# Patient Record
Sex: Female | Born: 1940 | Race: White | Hispanic: No | Marital: Married | Smoking: Never smoker
Health system: Southern US, Community
[De-identification: ages and names within clinical notes are randomized; demographics above are authoritative.]

## PROBLEM LIST (undated history)

## (undated) DIAGNOSIS — I82409 Acute embolism and thrombosis of unspecified deep veins of unspecified lower extremity: Secondary | ICD-10-CM

## (undated) DIAGNOSIS — E785 Hyperlipidemia, unspecified: Secondary | ICD-10-CM

## (undated) DIAGNOSIS — G629 Polyneuropathy, unspecified: Secondary | ICD-10-CM

## (undated) DIAGNOSIS — K219 Gastro-esophageal reflux disease without esophagitis: Secondary | ICD-10-CM

## (undated) DIAGNOSIS — C50919 Malignant neoplasm of unspecified site of unspecified female breast: Secondary | ICD-10-CM

## (undated) DIAGNOSIS — I272 Pulmonary hypertension, unspecified: Secondary | ICD-10-CM

## (undated) DIAGNOSIS — G2581 Restless legs syndrome: Secondary | ICD-10-CM

## (undated) DIAGNOSIS — M549 Dorsalgia, unspecified: Secondary | ICD-10-CM

## (undated) DIAGNOSIS — J449 Chronic obstructive pulmonary disease, unspecified: Secondary | ICD-10-CM

## (undated) DIAGNOSIS — G8929 Other chronic pain: Secondary | ICD-10-CM

## (undated) HISTORY — PX: JOINT REPLACEMENT: SHX530

## (undated) HISTORY — PX: CHOLECYSTECTOMY: SHX55

## (undated) HISTORY — PX: OTHER SURGICAL HISTORY: SHX169

## (undated) HISTORY — PX: ABDOMINAL HYSTERECTOMY: SHX81

## (undated) HISTORY — PX: BREAST SURGERY: SHX581

## (undated) HISTORY — PX: CARPAL TUNNEL RELEASE: SHX101

---

## 2012-04-24 ENCOUNTER — Encounter (HOSPITAL_BASED_OUTPATIENT_CLINIC_OR_DEPARTMENT_OTHER): Payer: Self-pay | Admitting: *Deleted

## 2012-04-24 ENCOUNTER — Emergency Department (HOSPITAL_BASED_OUTPATIENT_CLINIC_OR_DEPARTMENT_OTHER)
Admission: EM | Admit: 2012-04-24 | Discharge: 2012-04-24 | Disposition: A | Discharge status: Home / Self Care | Payer: Worker's Compensation | Attending: Emergency Medicine | Admitting: Emergency Medicine

## 2012-04-24 ENCOUNTER — Emergency Department (HOSPITAL_BASED_OUTPATIENT_CLINIC_OR_DEPARTMENT_OTHER): Payer: Worker's Compensation

## 2012-04-24 DIAGNOSIS — Z862 Personal history of diseases of the blood and blood-forming organs and certain disorders involving the immune mechanism: Secondary | ICD-10-CM | POA: Insufficient documentation

## 2012-04-24 DIAGNOSIS — Z86718 Personal history of other venous thrombosis and embolism: Secondary | ICD-10-CM | POA: Insufficient documentation

## 2012-04-24 DIAGNOSIS — X500XXA Overexertion from strenuous movement or load, initial encounter: Secondary | ICD-10-CM | POA: Insufficient documentation

## 2012-04-24 DIAGNOSIS — Z8639 Personal history of other endocrine, nutritional and metabolic disease: Secondary | ICD-10-CM | POA: Insufficient documentation

## 2012-04-24 DIAGNOSIS — Z8739 Personal history of other diseases of the musculoskeletal system and connective tissue: Secondary | ICD-10-CM | POA: Insufficient documentation

## 2012-04-24 DIAGNOSIS — M659 Synovitis and tenosynovitis, unspecified: Secondary | ICD-10-CM

## 2012-04-24 DIAGNOSIS — J449 Chronic obstructive pulmonary disease, unspecified: Secondary | ICD-10-CM | POA: Insufficient documentation

## 2012-04-24 DIAGNOSIS — Z853 Personal history of malignant neoplasm of breast: Secondary | ICD-10-CM | POA: Insufficient documentation

## 2012-04-24 DIAGNOSIS — Z8669 Personal history of other diseases of the nervous system and sense organs: Secondary | ICD-10-CM | POA: Insufficient documentation

## 2012-04-24 DIAGNOSIS — Y9389 Activity, other specified: Secondary | ICD-10-CM | POA: Insufficient documentation

## 2012-04-24 DIAGNOSIS — M65849 Other synovitis and tenosynovitis, unspecified hand: Secondary | ICD-10-CM | POA: Insufficient documentation

## 2012-04-24 DIAGNOSIS — M65839 Other synovitis and tenosynovitis, unspecified forearm: Secondary | ICD-10-CM | POA: Insufficient documentation

## 2012-04-24 DIAGNOSIS — J4489 Other specified chronic obstructive pulmonary disease: Secondary | ICD-10-CM | POA: Insufficient documentation

## 2012-04-24 DIAGNOSIS — K219 Gastro-esophageal reflux disease without esophagitis: Secondary | ICD-10-CM | POA: Insufficient documentation

## 2012-04-24 DIAGNOSIS — Y929 Unspecified place or not applicable: Secondary | ICD-10-CM | POA: Insufficient documentation

## 2012-04-24 DIAGNOSIS — Z8679 Personal history of other diseases of the circulatory system: Secondary | ICD-10-CM | POA: Insufficient documentation

## 2012-04-24 DIAGNOSIS — Z79899 Other long term (current) drug therapy: Secondary | ICD-10-CM | POA: Insufficient documentation

## 2012-04-24 HISTORY — DX: Gastro-esophageal reflux disease without esophagitis: K21.9

## 2012-04-24 HISTORY — DX: Malignant neoplasm of unspecified site of unspecified female breast: C50.919

## 2012-04-24 HISTORY — DX: Restless legs syndrome: G25.81

## 2012-04-24 HISTORY — DX: Other chronic pain: G89.29

## 2012-04-24 HISTORY — DX: Chronic obstructive pulmonary disease, unspecified: J44.9

## 2012-04-24 HISTORY — DX: Dorsalgia, unspecified: M54.9

## 2012-04-24 HISTORY — DX: Hyperlipidemia, unspecified: E78.5

## 2012-04-24 HISTORY — DX: Pulmonary hypertension, unspecified: I27.20

## 2012-04-24 HISTORY — DX: Acute embolism and thrombosis of unspecified deep veins of unspecified lower extremity: I82.409

## 2012-04-24 HISTORY — DX: Polyneuropathy, unspecified: G62.9

## 2012-04-24 NOTE — ED Notes (Signed)
Pt c/o left hand injury x 2 days ago  

## 2012-04-24 NOTE — ED Provider Notes (Signed)
History     CSN: [id number]  Arrival date & time 04/24/12  1643   First MD Initiated Contact with Patient 04/24/12 1835      Chief Complaint  Patient presents with  . Hand Injury    (Consider location/radiation/quality/duration/timing/severity/associated sxs/prior treatment) Patient is a 72 y.o. female presenting with hand injury. The history is provided by the patient.  Hand Injury Location:  Hand (Pt was lifting a ream of paper yesterday.  When she picked up the paper she had a sharp pain in the palm of the right hand.  She has continued to have pain whenever she tries to pick something up with her right hand.) Time since incident:  1 day Injury: yes   Mechanism of injury comment:  She lifted a ream of paper. Hand location:  R palm Pain details:    Quality:  Sharp   Radiates to:  Does not radiate   Severity:  Moderate   Onset quality:  Sudden   Duration:  1 day   Timing:  Intermittent Chronicity:  New Handedness:  Right-handed Dislocation: no   Foreign body present:  No foreign bodies Prior injury to area:  No Relieved by:  Nothing Worsened by:  Nothing tried Ineffective treatments:  None tried Associated symptoms comment:  She has crronic back pain and postherpetic neuropathy.   Past Medical History  Diagnosis Date  . Neuropathy   . Chronic back pain   . Hyperlipemia   . DVT (deep venous thrombosis)   . RLS (restless legs syndrome)   . GERD (gastroesophageal reflux disease)   . COPD (chronic obstructive pulmonary disease)   . Pulmonary hypertension   . Breast cancer     Past Surgical History  Procedure Laterality Date  . Breast surgery    . Cholecystectomy    . Joint replacement    . Carpal tunnel release    . Bladder tact    . Abdominal hysterectomy      History reviewed. No pertinent family history.  History  Substance Use Topics  . Smoking status: Never Smoker   . Smokeless tobacco: Not on file  . Alcohol Use: No    OB History   Grav Para  Term Preterm Abortions TAB SAB Ect Mult Living                  Review of Systems  All other systems reviewed and are negative.    Allergies  Review of patient's allergies indicates not on file.  Home Medications   Current Outpatient Rx  Name  Route  Sig  Dispense  Refill  . esomeprazole (NEXIUM) 40 MG capsule   Oral   Take 40 mg by mouth daily before breakfast.         . gabapentin (NEURONTIN) 300 MG capsule   Oral   Take 300 mg by mouth 3 (three) times daily.         Marland Kitchen oxyCODONE (OXYCONTIN) 10 MG 12 hr tablet   Oral   Take 10 mg by mouth every 12 (twelve) hours.         . pramipexole (MIRAPEX) 0.5 MG tablet   Oral   Take 0.5 mg by mouth 3 (three) times daily.         . traMADol (ULTRAM) 50 MG tablet   Oral   Take 50 mg by mouth every 6 (six) hours as needed for pain.           BP 125/65  Pulse 78  Temp(Src) 98.6 F (37 C) (Oral)  Resp 18  Ht 5\' 1"  (1.549 m)  Wt 150 lb (68.04 kg)  BMI 28.36 kg/m2  SpO2 99%  Physical Exam  Nursing note and vitals reviewed. Constitutional: She is oriented to person, place, and time. She appears well-developed and well-nourished. She appears distressed.  In mild distress with pain in the right palm.  Musculoskeletal:  She localizes pain to the palmar surface of the right hand.  The tender area overlies the palmar surface of the right 4th and 5th metacarpal bone, over the course of the flexor tendons to the 4th and 5th fingers.  There is no palpable deformity.  She has intact ability to flex and extend her fingers, and has intact sensation in her hand.  The skin is intact without injury or rash.  Neurological: She is alert and oriented to person, place, and time.  No sensory or motor deficit.  Psychiatric: She has a normal mood and affect. Her behavior is normal.    ED Course  Procedures (including critical care time)  Labs Reviewed - No data to display Dg Hand Complete Right  04/24/2012  *RADIOLOGY REPORT*   Clinical Data: 72 year old female with pain in the ring finger.  RIGHT HAND - COMPLETE 3+ VIEW  Comparison: None.  Findings: Bone mineralization is within normal limits for age. Distal radius and ulna intact.  Carpal bone alignment and joint spaces within normal limits.  Metacarpals intact.  Ring finger osseous structures intact.  No acute fracture.  IMPRESSION: No acute fracture or dislocation identified about the right hand.   Original Report Authenticated By: Erskine Speed, M.D.    6:52 PM Pt was seen and had physical examination of the right hand, the injured area.  X-rays showed no fracture.  She was advised to use an ulnar gutter splint until pain free.  1. Tenosynovitis of hand        Carleene Cooper III, MD 04/25/12 1013

## 2012-04-24 NOTE — ED Notes (Signed)
Patient transported to X-ray 

## 2013-04-02 ENCOUNTER — Encounter (HOSPITAL_BASED_OUTPATIENT_CLINIC_OR_DEPARTMENT_OTHER): Payer: Self-pay | Admitting: Emergency Medicine

## 2013-04-02 ENCOUNTER — Emergency Department (HOSPITAL_BASED_OUTPATIENT_CLINIC_OR_DEPARTMENT_OTHER)
Admission: EM | Admit: 2013-04-02 | Discharge: 2013-04-02 | Disposition: A | Discharge status: Home / Self Care | Payer: Medicare Other | Attending: Emergency Medicine | Admitting: Emergency Medicine

## 2013-04-02 DIAGNOSIS — Z91040 Latex allergy status: Secondary | ICD-10-CM | POA: Insufficient documentation

## 2013-04-02 DIAGNOSIS — G2581 Restless legs syndrome: Secondary | ICD-10-CM | POA: Diagnosis not present

## 2013-04-02 DIAGNOSIS — Z79899 Other long term (current) drug therapy: Secondary | ICD-10-CM | POA: Diagnosis not present

## 2013-04-02 DIAGNOSIS — E785 Hyperlipidemia, unspecified: Secondary | ICD-10-CM | POA: Insufficient documentation

## 2013-04-02 DIAGNOSIS — J449 Chronic obstructive pulmonary disease, unspecified: Secondary | ICD-10-CM | POA: Insufficient documentation

## 2013-04-02 DIAGNOSIS — W5511XA Bitten by horse, initial encounter: Secondary | ICD-10-CM

## 2013-04-02 DIAGNOSIS — Z86718 Personal history of other venous thrombosis and embolism: Secondary | ICD-10-CM | POA: Diagnosis not present

## 2013-04-02 DIAGNOSIS — Y9389 Activity, other specified: Secondary | ICD-10-CM | POA: Insufficient documentation

## 2013-04-02 DIAGNOSIS — G589 Mononeuropathy, unspecified: Secondary | ICD-10-CM | POA: Diagnosis not present

## 2013-04-02 DIAGNOSIS — Z7901 Long term (current) use of anticoagulants: Secondary | ICD-10-CM | POA: Diagnosis not present

## 2013-04-02 DIAGNOSIS — J4489 Other specified chronic obstructive pulmonary disease: Secondary | ICD-10-CM | POA: Insufficient documentation

## 2013-04-02 DIAGNOSIS — Z853 Personal history of malignant neoplasm of breast: Secondary | ICD-10-CM | POA: Insufficient documentation

## 2013-04-02 DIAGNOSIS — S40019A Contusion of unspecified shoulder, initial encounter: Secondary | ICD-10-CM | POA: Diagnosis not present

## 2013-04-02 DIAGNOSIS — IMO0001 Reserved for inherently not codable concepts without codable children: Secondary | ICD-10-CM | POA: Diagnosis not present

## 2013-04-02 DIAGNOSIS — IMO0002 Reserved for concepts with insufficient information to code with codable children: Secondary | ICD-10-CM | POA: Insufficient documentation

## 2013-04-02 DIAGNOSIS — Z23 Encounter for immunization: Secondary | ICD-10-CM | POA: Insufficient documentation

## 2013-04-02 DIAGNOSIS — I2789 Other specified pulmonary heart diseases: Secondary | ICD-10-CM | POA: Insufficient documentation

## 2013-04-02 DIAGNOSIS — K219 Gastro-esophageal reflux disease without esophagitis: Secondary | ICD-10-CM | POA: Insufficient documentation

## 2013-04-02 DIAGNOSIS — G8929 Other chronic pain: Secondary | ICD-10-CM | POA: Diagnosis not present

## 2013-04-02 DIAGNOSIS — Y9289 Other specified places as the place of occurrence of the external cause: Secondary | ICD-10-CM | POA: Diagnosis not present

## 2013-04-02 MED ORDER — TETANUS-DIPHTH-ACELL PERTUSSIS 5-2.5-18.5 LF-MCG/0.5 IM SUSP
0.5000 mL | Freq: Once | INTRAMUSCULAR | Status: AC
  Administered 2013-04-02: 0.5 mL via INTRAMUSCULAR
  Filled 2013-04-02: qty 0.5

## 2013-04-02 MED ORDER — AMOXICILLIN-POT CLAVULANATE 875-125 MG PO TABS: 1.0000 | ORAL_TABLET | Freq: Two times a day (BID) | ORAL | Status: AC

## 2013-04-02 NOTE — ED Provider Notes (Signed)
CSN: [id number]     Arrival date & time 04/02/13  1812 History   This chart was scribed for Blanchie Dessert, MD by Ladene Artist, ED Scribe. The patient was seen in room MH03/MH03. Patient's care was started at 7:43 PM.     Chief Complaint  Patient presents with  . Animal Bite     Patient is a 73 y.o. female presenting with animal bite. The history is provided by the patient. No language interpreter was used.  Animal Bite Attacking animal: horse. Location:  Shoulder/arm Shoulder/arm injury location:  L shoulder Pain details:    Quality:  Burning   Timing:  Constant   Progression:  Unchanged Provoked: unprovoked   Notifications:  None Animal's rabies vaccination status:  Unknown Tetanus status:  Unknown Relieved by:  None tried Worsened by:  Nothing tried Ineffective treatments:  None tried Associated symptoms: no fever    HPI Comments: Tara Doyle is a 73 y.o. female who presents to the Emergency Department complaining of a horse bite to her left upper shoulder onset earlier today. Pt states that the horse lunged over the fence while she was emptying a barrel before the horse bite her. She reports a burning sensation to her left upper shoulder. Pt also reports falling immediately after the attack. Pt has not used anything to treat the area. Pt does not recall her last tetanus injection.  Past Medical History  Diagnosis Date  . Neuropathy   . Chronic back pain   . Hyperlipemia   . DVT (deep venous thrombosis)   . RLS (restless legs syndrome)   . GERD (gastroesophageal reflux disease)   . COPD (chronic obstructive pulmonary disease)   . Pulmonary hypertension   . Breast cancer    Past Surgical History  Procedure Laterality Date  . Breast surgery    . Cholecystectomy    . Joint replacement    . Carpal tunnel release    . Bladder tact    . Abdominal hysterectomy     No family history on file. History  Substance Use Topics  . Smoking status: Never Smoker   .  Smokeless tobacco: Not on file  . Alcohol Use: No   OB History   Grav Para Term Preterm Abortions TAB SAB Ect Mult Living                 Review of Systems  Constitutional: Negative for fever and chills.  Gastrointestinal: Negative for nausea.  Skin: Positive for wound (animal bite ).  Hematological: Bruises/bleeds easily.  All other systems reviewed and are negative.    Allergies  Doxycycline; Erythromycin; Latex; and Topamax  Home Medications   Current Outpatient Rx  Name  Route  Sig  Dispense  Refill  . Atorvastatin Calcium (LIPITOR PO)   Oral   Take by mouth.         . hydrochlorothiazide (HYDRODIURIL) 25 MG tablet   Oral   Take 25 mg by mouth daily.         Marland Kitchen torsemide (DEMADEX) 10 MG tablet   Oral   Take 10 mg by mouth daily.         Marland Kitchen warfarin (COUMADIN) 5 MG tablet   Oral   Take 5 mg by mouth daily.         Marland Kitchen zolpidem (AMBIEN) 10 MG tablet   Oral   Take 10 mg by mouth at bedtime as needed for sleep.         Marland Kitchen esomeprazole ([place])  40 MG capsule   Oral   Take 40 mg by mouth daily before breakfast.         . gabapentin (NEURONTIN) 300 MG capsule   Oral   Take 300 mg by mouth 3 (three) times daily.         Marland Kitchen oxyCODONE (OXYCONTIN) 10 MG 12 hr tablet   Oral   Take 10 mg by mouth every 12 (twelve) hours.         . pramipexole (MIRAPEX) 0.5 MG tablet   Oral   Take 0.5 mg by mouth 3 (three) times daily.         . traMADol (ULTRAM) 50 MG tablet   Oral   Take 50 mg by mouth every 6 (six) hours as needed for pain.          Triage Vitals: BP 147/78  Pulse 68  Temp(Src) 98.9 F (37.2 C) (Oral)  Resp 20  Ht 5\' 1"  (1.549 m)  Wt 158 lb (71.668 kg)  BMI 29.87 kg/m2  SpO2 98%  Physical Exam  Nursing note and vitals reviewed. Constitutional: She appears well-developed and well-nourished. No distress.  HENT:  Head: Normocephalic.  Eyes: Conjunctivae and EOM are normal. Pupils are equal, round, and reactive to light.   Cardiovascular: Normal rate, regular rhythm, normal heart sounds and intact distal pulses.   Pulmonary/Chest: Effort normal and breath sounds normal.  Musculoskeletal: Normal range of motion.       Left shoulder: Normal.  Neurological: She is alert.  Skin: Skin is warm. Abrasion (superficial) and ecchymosis noted.     Surrounding hematoma     ED Course  Procedures (including critical care time) DIAGNOSTIC STUDIES: Oxygen Saturation is 98% on RA, normal by my interpretation.    COORDINATION OF CARE: 7:47 PM-Discussed treatment plan which includes at home care and tetanus injection with pt at bedside. Pt agreed to plan.   Labs Review Labs Reviewed - No data to display Imaging Review No results found.   EKG Interpretation None      MDM   Final diagnoses:  Horse bite    Pt with horse bite to the scapula without other injury.  Skin was broken but no deep lacerations.  Tetanus updated and pt given wound cleaning instructions and given augmentin to prevent infection.  I personally performed the services described in this documentation, which was scribed in my presence.  The recorded information has been reviewed and considered.    Blanchie Dessert, MD 04/02/13 2006

## 2013-04-02 NOTE — Discharge Instructions (Signed)

## 2013-04-02 NOTE — ED Notes (Signed)
Horse bite to rt upper back,  Bruising and abrasion noted  Bleeding controlled

## 2013-04-02 NOTE — ED Notes (Signed)
Pt c/o horse biting her on left upper shoulder today.

## 2014-05-08 IMAGING — CR DG HAND COMPLETE 3+V*R*
3 series · 3 of 3 positions shown · non-contrast
Comparison: None.

CLINICAL DATA: 71-year-old female with pain in the ring finger.

RIGHT HAND - COMPLETE 3+ VIEW

[x hand pa right]
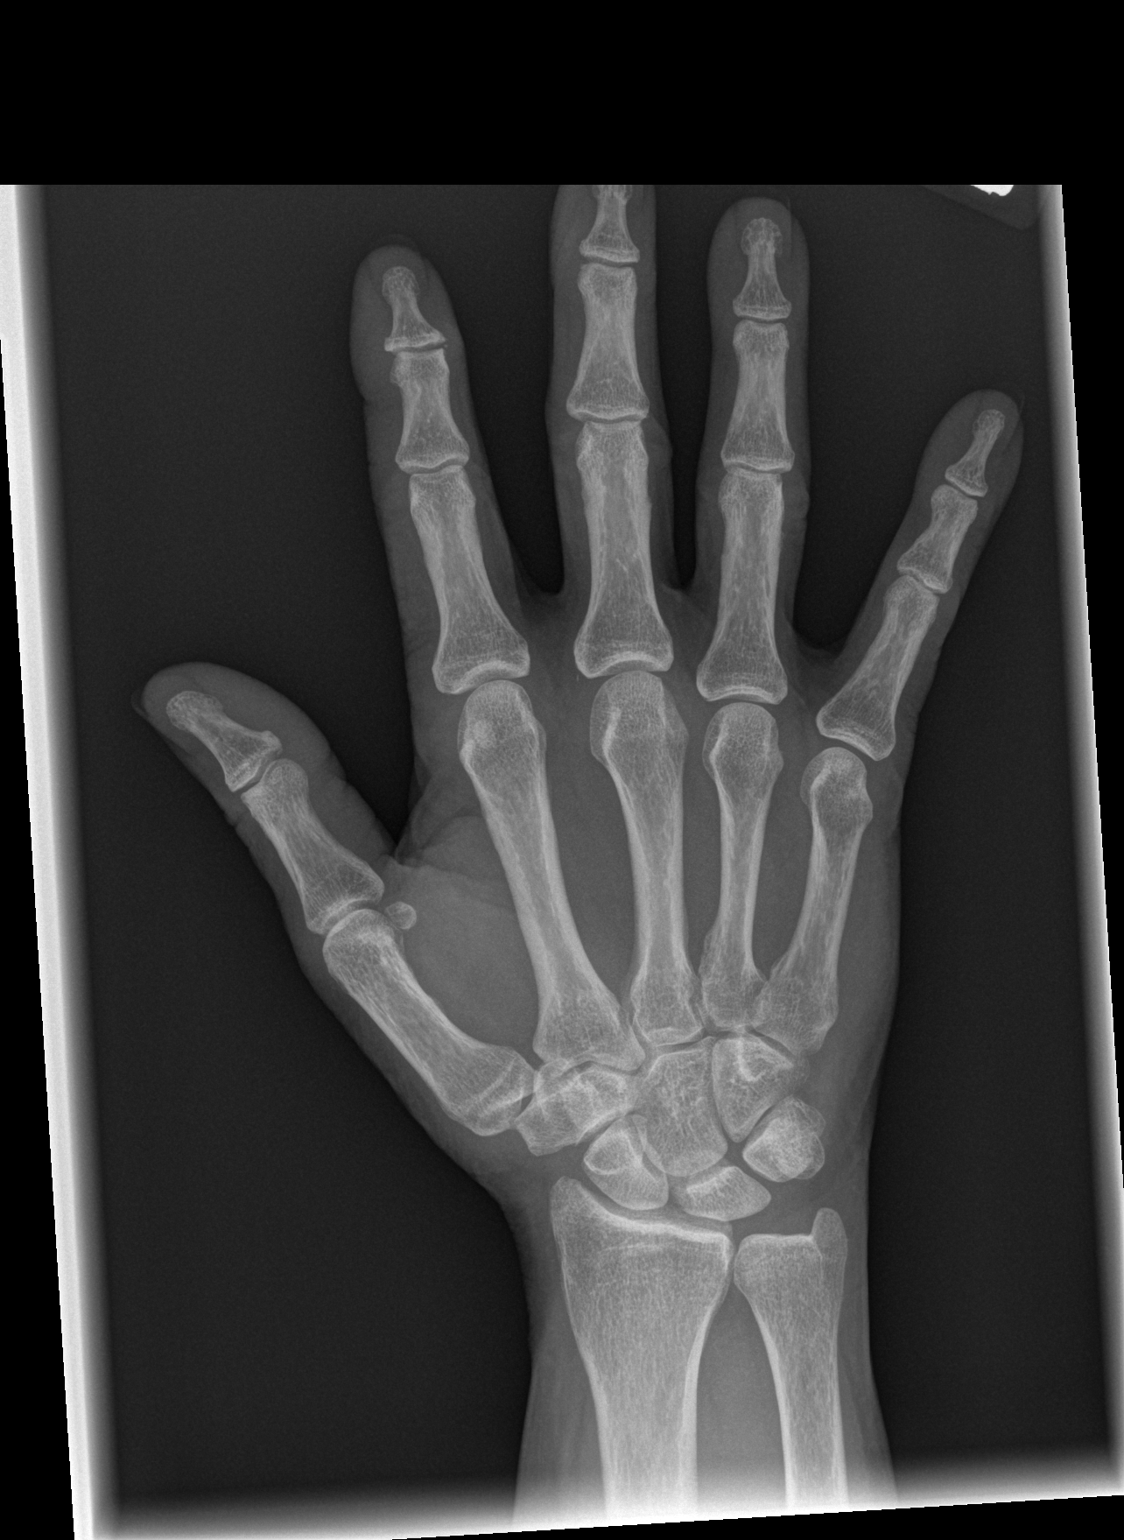

[x hand oblique right]
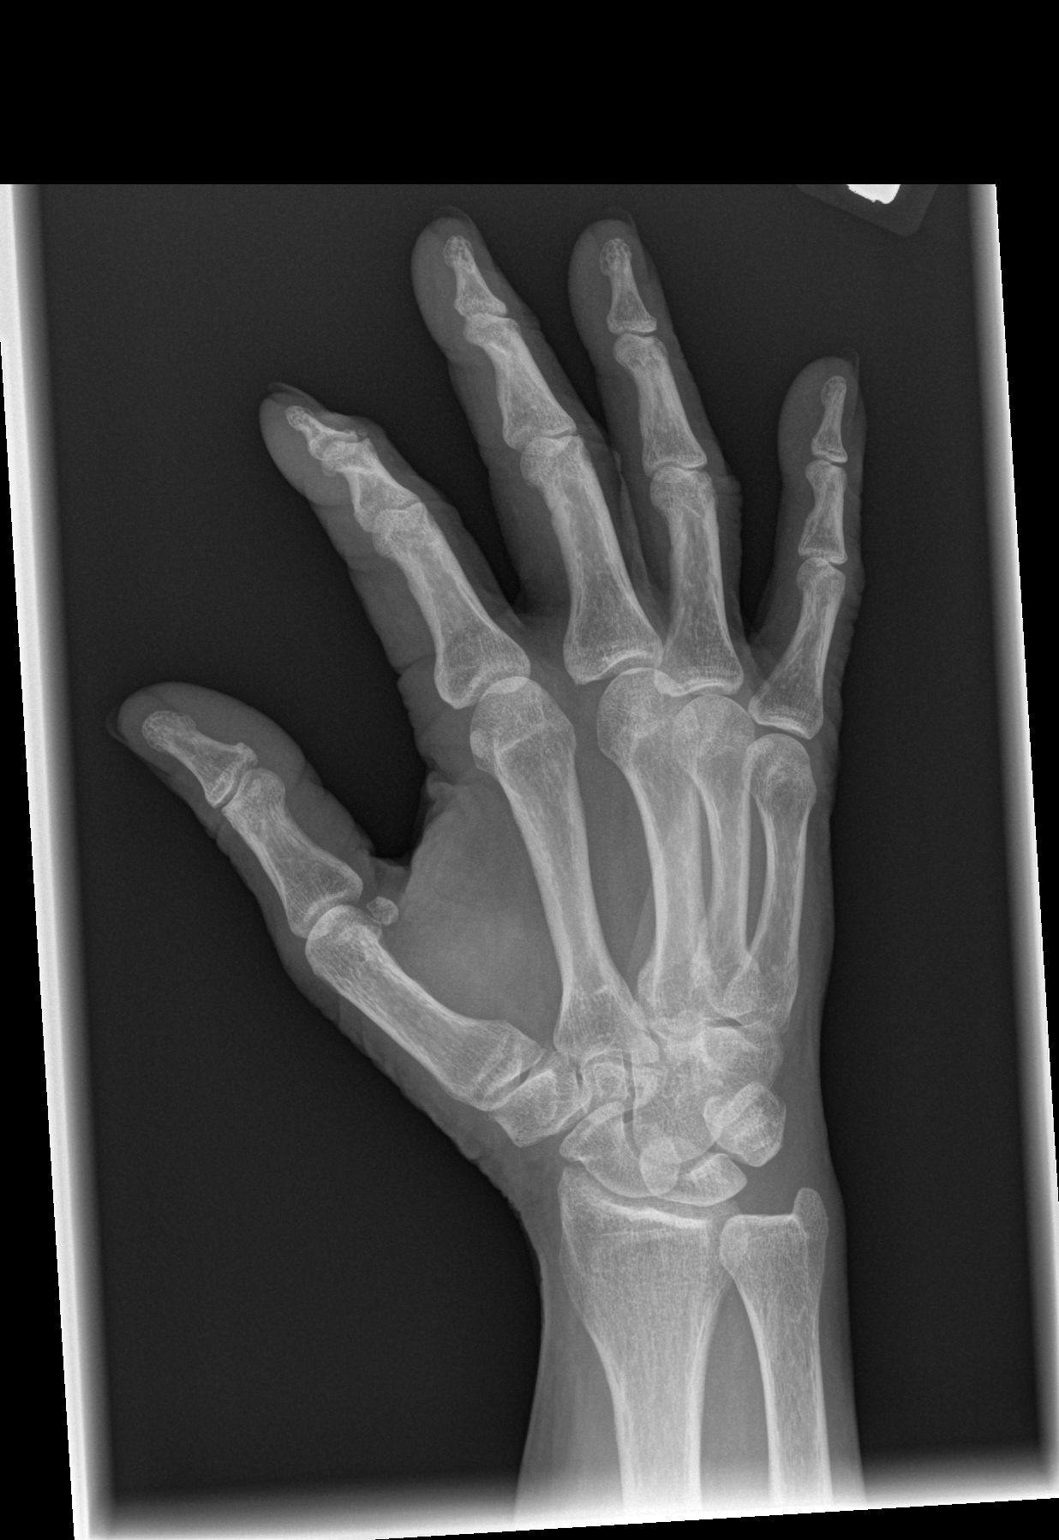

[x hand lat right]
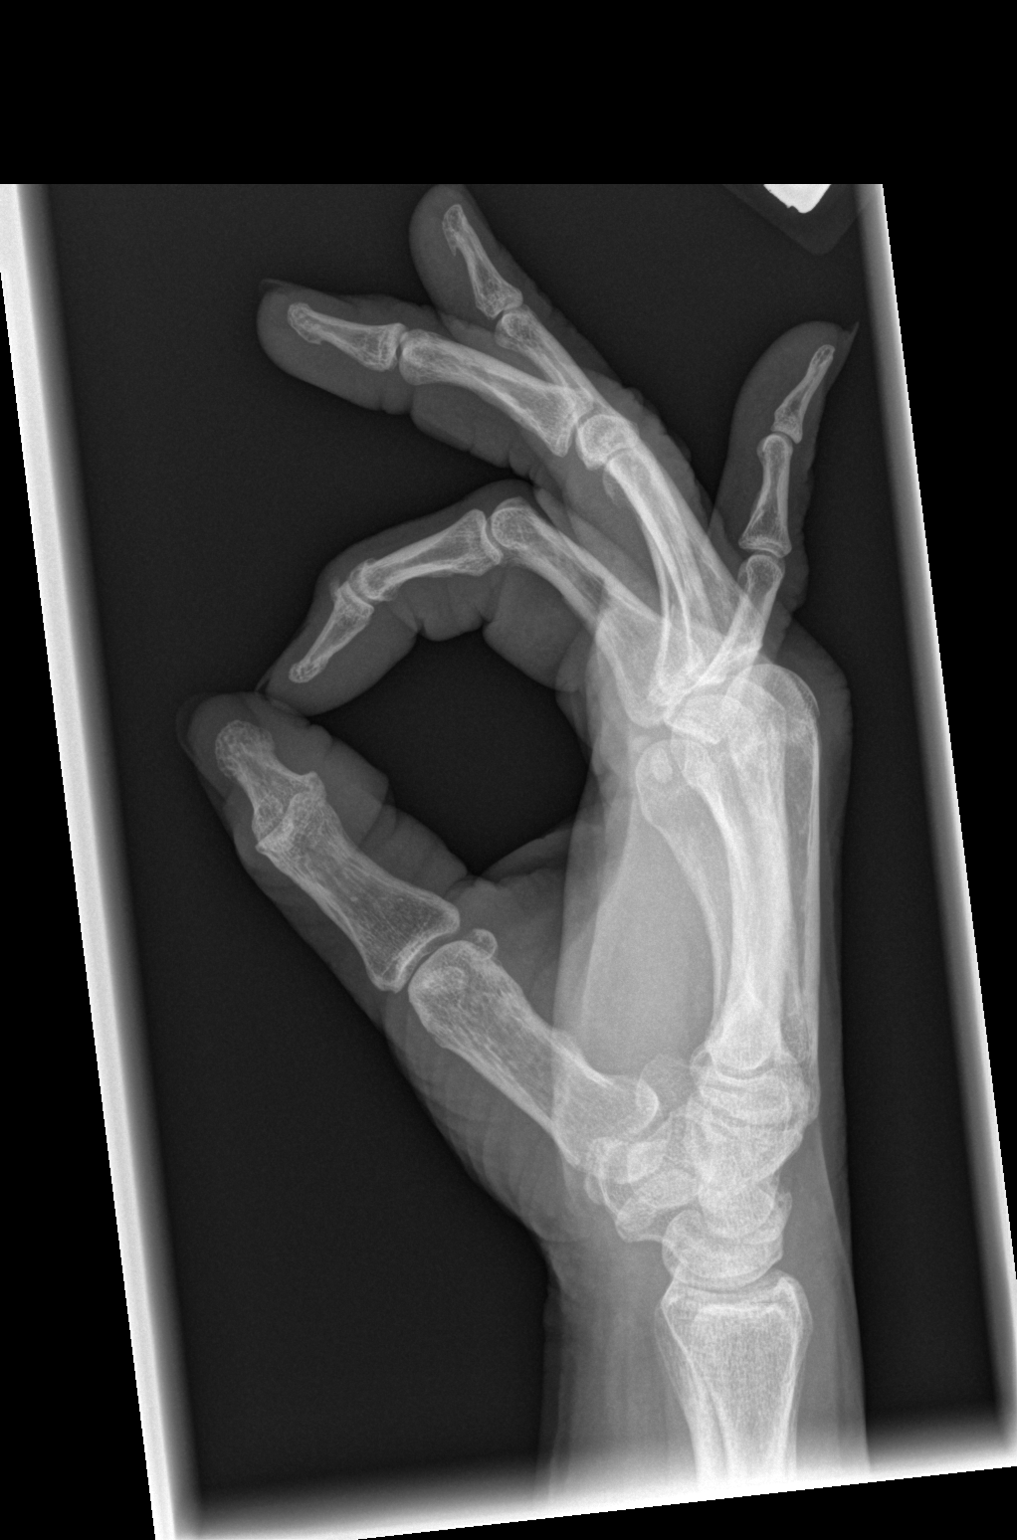

[3 of 3 positions shown; findings below may reference images not displayed]

FINDINGS: Bone mineralization is within normal limits for age.
Distal radius and ulna intact.  Carpal bone alignment and joint
spaces within normal limits.  Metacarpals intact.  Ring finger
osseous structures intact.  No acute fracture.
IMPRESSION: No acute fracture or dislocation identified about the right hand.

## 2016-01-31 ENCOUNTER — Encounter (INDEPENDENT_AMBULATORY_CARE_PROVIDER_SITE_OTHER): Payer: Medicare Other | Admitting: Ophthalmology

## 2016-01-31 DIAGNOSIS — H35033 Hypertensive retinopathy, bilateral: Secondary | ICD-10-CM

## 2016-01-31 DIAGNOSIS — H2513 Age-related nuclear cataract, bilateral: Secondary | ICD-10-CM | POA: Diagnosis not present

## 2016-01-31 DIAGNOSIS — H43813 Vitreous degeneration, bilateral: Secondary | ICD-10-CM

## 2016-01-31 DIAGNOSIS — I10 Essential (primary) hypertension: Secondary | ICD-10-CM

## 2020-12-02 DEATH — deceased
